# Patient Record
Sex: Male | Born: 1960 | Race: White | Hispanic: No | Smoking: Former smoker
Health system: Southern US, Community
[De-identification: ages and names within clinical notes are randomized; demographics above are authoritative.]

## PROBLEM LIST (undated history)

## (undated) DIAGNOSIS — Z972 Presence of dental prosthetic device (complete) (partial): Secondary | ICD-10-CM

## (undated) DIAGNOSIS — R351 Nocturia: Secondary | ICD-10-CM

## (undated) DIAGNOSIS — M75102 Unspecified rotator cuff tear or rupture of left shoulder, not specified as traumatic: Secondary | ICD-10-CM

## (undated) DIAGNOSIS — Z87820 Personal history of traumatic brain injury: Secondary | ICD-10-CM

## (undated) DIAGNOSIS — M109 Gout, unspecified: Secondary | ICD-10-CM

## (undated) DIAGNOSIS — Z8711 Personal history of peptic ulcer disease: Secondary | ICD-10-CM

## (undated) DIAGNOSIS — Z8719 Personal history of other diseases of the digestive system: Secondary | ICD-10-CM

## (undated) DIAGNOSIS — M5020 Other cervical disc displacement, unspecified cervical region: Secondary | ICD-10-CM

## (undated) DIAGNOSIS — Z9189 Other specified personal risk factors, not elsewhere classified: Secondary | ICD-10-CM

## (undated) DIAGNOSIS — Z973 Presence of spectacles and contact lenses: Secondary | ICD-10-CM

## (undated) DIAGNOSIS — I341 Nonrheumatic mitral (valve) prolapse: Secondary | ICD-10-CM

## (undated) DIAGNOSIS — S62501B Fracture of unspecified phalanx of right thumb, initial encounter for open fracture: Secondary | ICD-10-CM

## (undated) DIAGNOSIS — K219 Gastro-esophageal reflux disease without esophagitis: Secondary | ICD-10-CM

---

## 1979-08-25 HISTORY — PX: OTHER SURGICAL HISTORY: SHX169

## 1989-08-24 HISTORY — PX: COLONOSCOPY: SHX174

## 2001-11-02 ENCOUNTER — Emergency Department (HOSPITAL_COMMUNITY): Admission: EM | Admit: 2001-11-02 | Discharge: 2001-11-02 | Payer: Self-pay | Admitting: Emergency Medicine

## 2001-11-03 ENCOUNTER — Encounter: Payer: Self-pay | Admitting: Emergency Medicine

## 2006-07-13 ENCOUNTER — Ambulatory Visit: Payer: Self-pay | Admitting: Psychiatry

## 2006-07-13 ENCOUNTER — Inpatient Hospital Stay (HOSPITAL_COMMUNITY): Admission: AD | Admit: 2006-07-13 | Discharge: 2006-07-16 | Payer: Self-pay | Admitting: Psychiatry

## 2006-07-13 ENCOUNTER — Emergency Department (HOSPITAL_COMMUNITY): Admission: EM | Admit: 2006-07-13 | Discharge: 2006-07-13 | Payer: Self-pay | Admitting: Emergency Medicine

## 2007-10-09 ENCOUNTER — Emergency Department (HOSPITAL_COMMUNITY): Admission: EM | Admit: 2007-10-09 | Discharge: 2007-10-10 | Payer: Self-pay | Admitting: Emergency Medicine

## 2010-06-02 ENCOUNTER — Emergency Department: Payer: Self-pay | Admitting: Emergency Medicine

## 2010-10-25 ENCOUNTER — Emergency Department (HOSPITAL_COMMUNITY): Admission: EM | Admit: 2010-10-25 | Discharge: 2010-10-25 | Payer: Self-pay | Admitting: Emergency Medicine

## 2011-03-06 LAB — POCT CARDIAC MARKERS
CKMB, poc: 2.1 ng/mL (ref 1.0–8.0)
Myoglobin, poc: 113 ng/mL (ref 12–200)
Troponin i, poc: <0.05 ng/mL (ref 0.00–0.09)

## 2011-03-06 LAB — RAPID URINE DRUG SCREEN, HOSP PERFORMED
Amphetamines: NOT DETECTED
Barbiturates: NOT DETECTED
Benzodiazepines: NOT DETECTED
Cocaine: NOT DETECTED
Opiates: NOT DETECTED
Tetrahydrocannabinol: NOT DETECTED

## 2011-03-06 LAB — POCT I-STAT, CHEM 8
BUN: 12 mg/dL (ref 6–23)
Calcium, Ion: 1.08 mmol/L — ABNORMAL LOW (ref 1.12–1.32)
Chloride: 107 mEq/L (ref 96–112)
Creatinine, Ser: 0.9 mg/dL (ref 0.4–1.5)
Glucose, Bld: 98 mg/dL (ref 70–99)
HCT: 46 % (ref 39.0–52.0)
Hemoglobin: 15.6 g/dL (ref 13.0–17.0)
Potassium: 3.7 mEq/L (ref 3.5–5.1)
Sodium: 140 mEq/L (ref 135–145)
TCO2: 25 mmol/L (ref 0–100)
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
## 2011-10-03 LAB — URINALYSIS, ROUTINE W REFLEX MICROSCOPIC
Nitrite: NEGATIVE
Specific Gravity, Urine: 1.023
Urobilinogen, UA: 0.2
pH: 5.5

## 2011-10-03 LAB — COMPREHENSIVE METABOLIC PANEL
ALT: 15
AST: 24
Alkaline Phosphatase: 61
CO2: 31
Calcium: 10
GFR calc Af Amer: >60
Glucose, Bld: 93
Potassium: 4.8
Sodium: 142
Total Protein: 6.3

## 2011-10-03 LAB — RAPID URINE DRUG SCREEN, HOSP PERFORMED
Amphetamines: POSITIVE — AB
Opiates: NOT DETECTED
Tetrahydrocannabinol: NOT DETECTED

## 2011-10-03 LAB — CBC
Hemoglobin: 13.9
RBC: 4.4
RDW: 12.8

## 2011-10-03 LAB — DIFFERENTIAL
Basophils Relative: 0
Eosinophils Absolute: 0.3
Eosinophils Relative: 3
Lymphs Abs: 2.3
Monocytes Relative: 4
Neutrophils Relative %: 69

## 2011-10-03 LAB — ETHANOL: Alcohol, Ethyl (B): <5

## 2011-10-31 IMAGING — CR DG CHEST 2V
3 series · 3 of 3 positions shown · non-contrast
Comparison: No

CLINICAL DATA: Smoker, chills, shortness of breath.  Chest pain,
palpitations.

CHEST - 2 VIEW

[w chest pa]
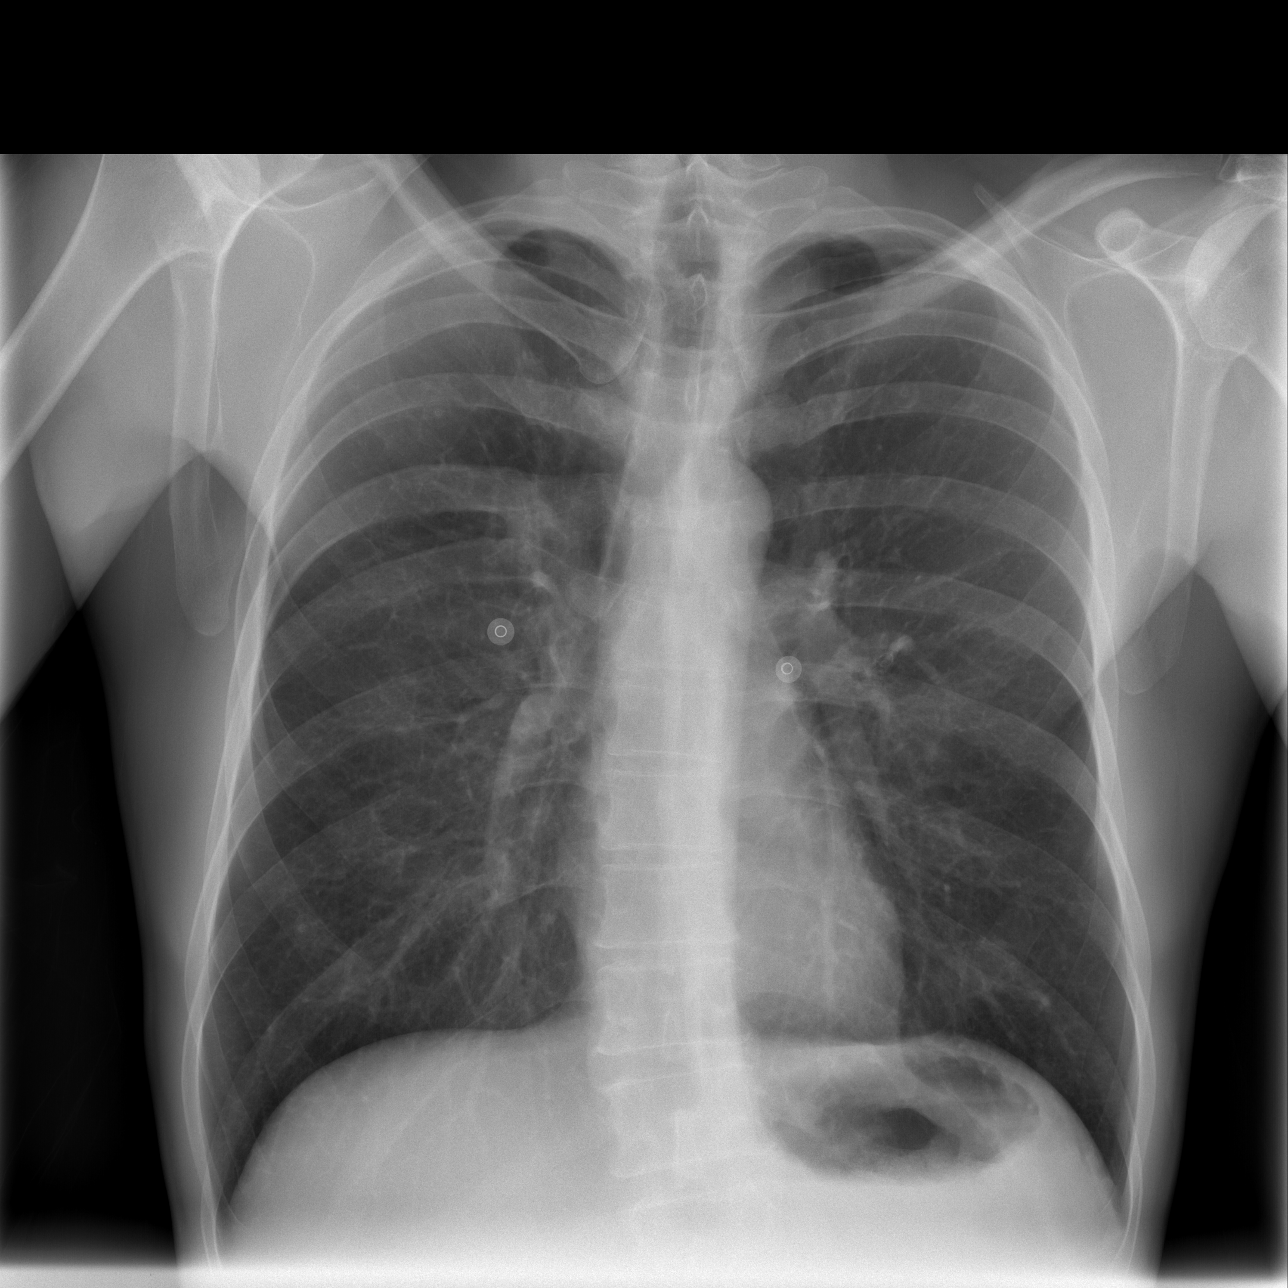

[w chest lat]
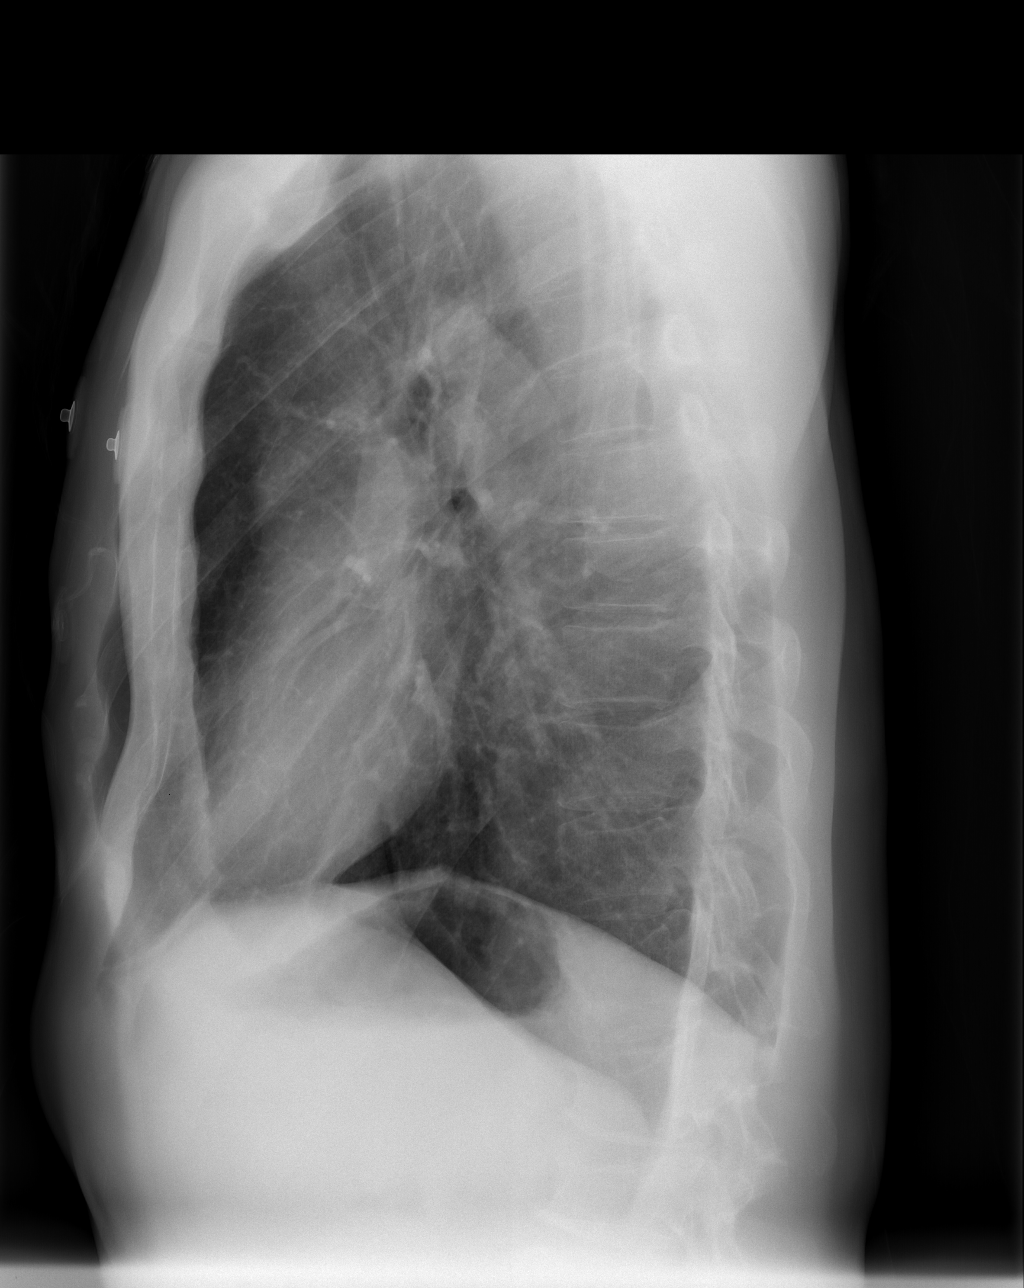

[w chest ap]
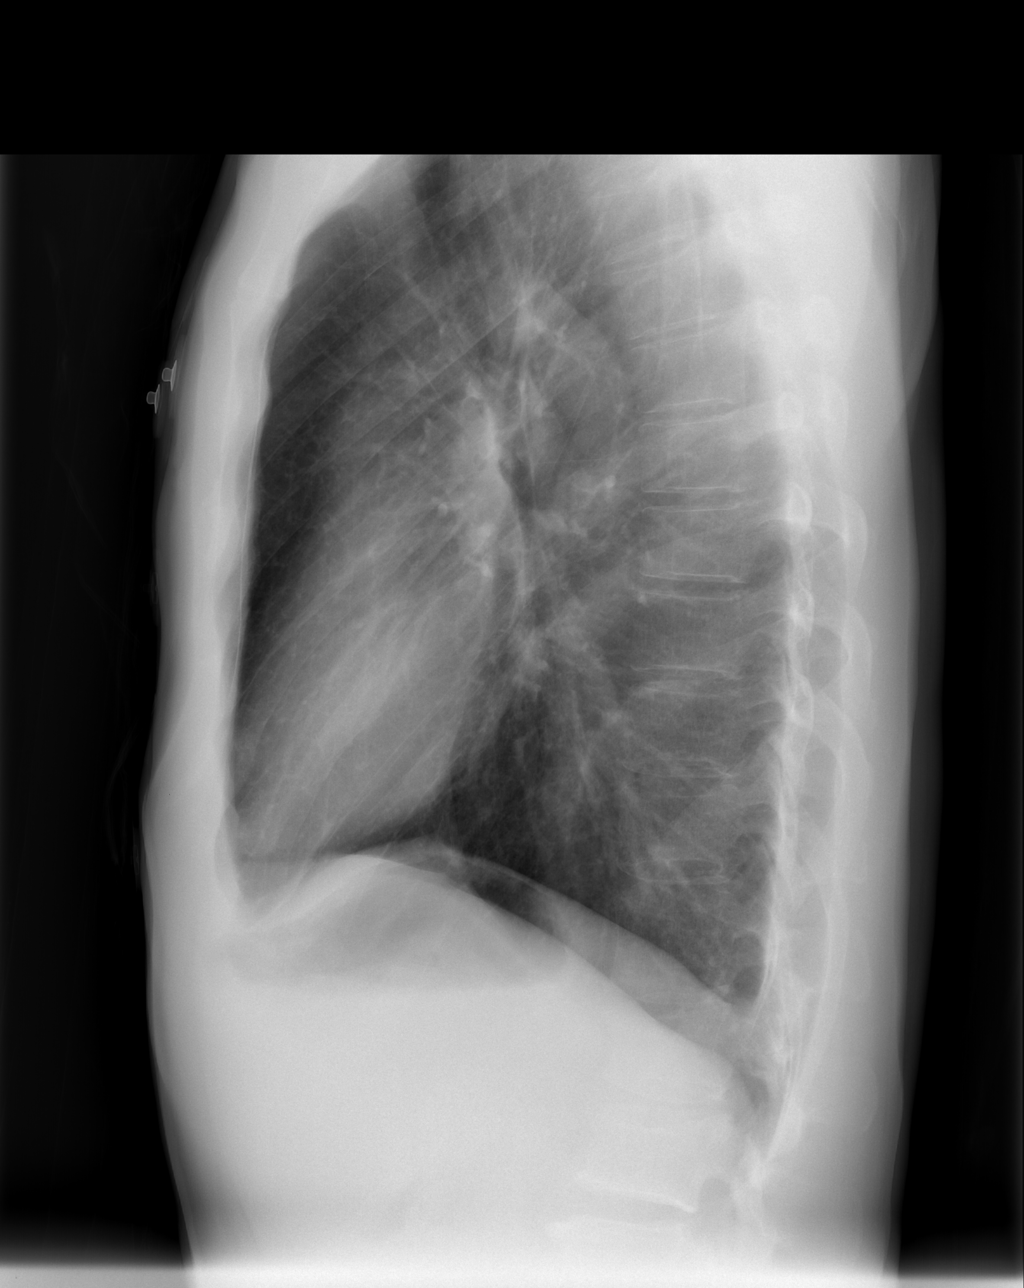

[3 of 3 positions shown; findings below may reference images not displayed]

FINDINGS: Mild hyperinflation of the lungs. Heart and mediastinal
contours are within normal limits.  No focal opacities or
effusions.  No acute bony abnormality.
IMPRESSION: No acute cardiopulmonary disease.

## 2011-11-13 ENCOUNTER — Ambulatory Visit: Payer: Self-pay

## 2014-09-05 ENCOUNTER — Emergency Department (HOSPITAL_COMMUNITY)
Admission: EM | Admit: 2014-09-05 | Discharge: 2014-09-05 | Disposition: A | Discharge status: Home / Self Care | Payer: Managed Care, Other (non HMO) | Attending: Emergency Medicine | Admitting: Emergency Medicine

## 2014-09-05 ENCOUNTER — Encounter (HOSPITAL_COMMUNITY): Payer: Self-pay | Admitting: Emergency Medicine

## 2014-09-05 ENCOUNTER — Emergency Department (HOSPITAL_COMMUNITY): Payer: Managed Care, Other (non HMO)

## 2014-09-05 DIAGNOSIS — Y9389 Activity, other specified: Secondary | ICD-10-CM | POA: Diagnosis not present

## 2014-09-05 DIAGNOSIS — Y9289 Other specified places as the place of occurrence of the external cause: Secondary | ICD-10-CM | POA: Insufficient documentation

## 2014-09-05 DIAGNOSIS — Z23 Encounter for immunization: Secondary | ICD-10-CM | POA: Diagnosis not present

## 2014-09-05 DIAGNOSIS — Z79899 Other long term (current) drug therapy: Secondary | ICD-10-CM | POA: Insufficient documentation

## 2014-09-05 DIAGNOSIS — Z792 Long term (current) use of antibiotics: Secondary | ICD-10-CM | POA: Diagnosis not present

## 2014-09-05 DIAGNOSIS — M109 Gout, unspecified: Secondary | ICD-10-CM | POA: Insufficient documentation

## 2014-09-05 DIAGNOSIS — W268XXA Contact with other sharp object(s), not elsewhere classified, initial encounter: Secondary | ICD-10-CM | POA: Diagnosis not present

## 2014-09-05 DIAGNOSIS — S62501B Fracture of unspecified phalanx of right thumb, initial encounter for open fracture: Secondary | ICD-10-CM

## 2014-09-05 DIAGNOSIS — S6990XA Unspecified injury of unspecified wrist, hand and finger(s), initial encounter: Secondary | ICD-10-CM | POA: Diagnosis present

## 2014-09-05 DIAGNOSIS — S6980XA Other specified injuries of unspecified wrist, hand and finger(s), initial encounter: Secondary | ICD-10-CM | POA: Insufficient documentation

## 2014-09-05 DIAGNOSIS — S62639B Displaced fracture of distal phalanx of unspecified finger, initial encounter for open fracture: Secondary | ICD-10-CM | POA: Insufficient documentation

## 2014-09-05 MED ORDER — CLINDAMYCIN PHOSPHATE 600 MG/50ML IV SOLN
600.0000 mg | Freq: Once | INTRAVENOUS | Status: AC
  Administered 2014-09-05: 600 mg via INTRAVENOUS
  Filled 2014-09-05: qty 50

## 2014-09-05 MED ORDER — HYDROMORPHONE HCL PF 1 MG/ML IJ SOLN
1.0000 mg | Freq: Once | INTRAMUSCULAR | Status: AC
  Administered 2014-09-05: 1 mg via INTRAVENOUS
  Filled 2014-09-05: qty 1

## 2014-09-05 MED ORDER — LIDOCAINE HCL 2 % IJ SOLN
5.0000 mL | Freq: Once | INTRAMUSCULAR | Status: AC
  Administered 2014-09-05: 100 mg
  Filled 2014-09-05: qty 20

## 2014-09-05 MED ORDER — CEPHALEXIN 500 MG PO CAPS: 500.0000 mg | ORAL_CAPSULE | Freq: Four times a day (QID) | ORAL | Status: DC

## 2014-09-05 MED ORDER — TETANUS-DIPHTH-ACELL PERTUSSIS 5-2.5-18.5 LF-MCG/0.5 IM SUSP
0.5000 mL | Freq: Once | INTRAMUSCULAR | Status: AC
  Administered 2014-09-05: 0.5 mL via INTRAMUSCULAR
  Filled 2014-09-05: qty 0.5

## 2014-09-05 MED ORDER — HYDROMORPHONE HCL PF 1 MG/ML IJ SOLN
0.5000 mg | Freq: Once | INTRAMUSCULAR | Status: AC
  Administered 2014-09-05: 0.5 mg via INTRAVENOUS
  Filled 2014-09-05: qty 1

## 2014-09-05 MED ORDER — HYDROMORPHONE HCL PF 1 MG/ML IJ SOLN: 1.0000 mg | Freq: Once | INTRAMUSCULAR | Status: AC

## 2014-09-05 MED ORDER — ONDANSETRON HCL 4 MG/2ML IJ SOLN
4.0000 mg | Freq: Once | INTRAMUSCULAR | Status: AC
  Administered 2014-09-05: 4 mg via INTRAVENOUS
  Filled 2014-09-05: qty 2

## 2014-09-05 MED ORDER — OXYCODONE-ACETAMINOPHEN 5-325 MG PO TABS: 1.0000 | ORAL_TABLET | Freq: Four times a day (QID) | ORAL | Status: DC | PRN

## 2014-09-05 NOTE — ED Provider Notes (Signed)
CSN: [id number]     Arrival date & time 09/05/14  1057 History   First MD Initiated Contact with Patient 09/05/14 1111     Chief Complaint  Patient presents with  . Finger Injury     (Consider location/radiation/quality/duration/timing/severity/associated sxs/prior Treatment) HPI Comments: Pt states that a shot time ago he was using a wood splitter and he cut the tip of the hit right thumb off. Denies previous injury. Bleeding is controlled.   The history is provided by the patient. No language interpreter was used.    Past Medical History  Diagnosis Date  . Gout    Past Surgical History  Procedure Laterality Date  . Rotator cuff repair     History reviewed. No pertinent family history. History  Substance Use Topics  . Smoking status: Not on file  . Smokeless tobacco: Not on file  . Alcohol Use: No    Review of Systems  Constitutional: Negative.   Respiratory: Negative.   Cardiovascular: Negative.       Allergies  Review of patient's allergies indicates no known allergies.  Home Medications   Prior to Admission medications   Medication Sig Start Date End Date Taking? Authorizing Provider  allopurinol (ZYLOPRIM) 100 MG tablet Take 100 mg by mouth daily. 05/30/14  Yes Historical Provider, MD  ALPRAZolam Prudy Feeler) 0.5 MG tablet Take 0.5 mg by mouth daily as needed for anxiety.  08/26/14  Yes Historical Provider, MD  amoxicillin (AMOXIL) 500 MG capsule Take 500 mg by mouth 3 (three) times daily.   Yes Historical Provider, MD  HYDROcodone-acetaminophen (NORCO/VICODIN) 5-325 MG per tablet Take 1 tablet by mouth daily.  09/02/14  Yes Historical Provider, MD  traMADol (ULTRAM) 50 MG tablet Take 50 mg by mouth 2 (two) times daily. 08/27/14  Yes Historical Provider, MD   BP 129/106  Pulse 82  Temp(Src) 97.9 F (36.6 C) (Oral)  Resp 18  SpO2 98% Physical Exam  Nursing note and vitals reviewed. Constitutional: He is oriented to person, place, and time. He appears well-developed  and well-nourished.  Cardiovascular: Normal rate and regular rhythm.   Pulmonary/Chest: Effort normal.  Musculoskeletal: Normal range of motion.  Amputated tip of the right thumb about 1/2 way thru the nail. Full rom  Neurological: He is alert and oriented to person, place, and time.    ED Course  NERVE BLOCK Date/Time: 09/05/2014 1:06 PM Performed by: Teressa Lower Authorized by: Teressa Lower Consent: Verbal consent obtained. Risks and benefits: risks, benefits and alternatives were discussed Consent given by: patient Patient identity confirmed: verbally with patient Indications: pain relief Body area: upper extremity Nerve: digital Laterality: right Patient tolerance: Patient tolerated the procedure well with no immediate complications. Comments: Wound cleaned and dressed   (including critical care time) Labs Review Labs Reviewed - No data to display  Imaging Review Dg Finger Thumb Right  09/05/2014   CLINICAL DATA:  Injury to the right thumb.  EXAM: RIGHT THUMB 2+V  COMPARISON:  No priors.  FINDINGS: There is a large soft tissue defect on the distal aspect of the right thumb. There appears to be a small osseous defect from the tuft of the first distal phalanx. Visualized bones otherwise appear intact.  IMPRESSION: 1. Small open fracture of the tuft of the distal phalanx of the right first digit.   Electronically Signed   By: Trudie Reed M.D.   On: 09/05/2014 12:31     EKG Interpretation None      MDM   Final diagnoses:  Thumb fracture, right, open, initial encounter    Spoke with Dr. Victorino Dike from ortho and he said to clean the area. Place a dressing and pt can follow up with Dr. Melvyn Novas tomorrow. Wound cleaned and dressed. Will send home with keflex and percocet of pain    Teressa Lower, NP 09/05/14 1325

## 2014-09-05 NOTE — ED Notes (Signed)
Reports injuring right thumb while using a wood splitter. Finger tip amputated. Bleeding controlled.

## 2014-09-05 NOTE — ED Notes (Signed)
Pt to xray

## 2014-09-05 NOTE — ED Notes (Signed)
Pt refusing to sit in bed and have vital signs updated, pt states "I can't sit still and need something else for pain, why can't you just numb my thumb." [place] notified.

## 2014-09-05 NOTE — ED Notes (Signed)
MD at bedside. 

## 2014-09-05 NOTE — Discharge Instructions (Signed)

## 2014-09-07 ENCOUNTER — Encounter (HOSPITAL_BASED_OUTPATIENT_CLINIC_OR_DEPARTMENT_OTHER): Payer: Self-pay | Admitting: *Deleted

## 2014-09-07 NOTE — Progress Notes (Signed)
NPO AFTER MN WITH EXCEPTION CLEAR LIQUIDS UNTIL 1000 (NO CREAM/ MILK PRODUCTS).  ARRIVE AT 1400. NEEDS HG. WILL TAKE ZANTAC AND TRAMADOL AM DOS W/ SIPS OF WATER.

## 2014-09-07 NOTE — Progress Notes (Signed)
09/07/14 1546  OBSTRUCTIVE SLEEP APNEA  Have you ever been diagnosed with sleep apnea through a sleep study? No  Do you snore loudly (loud enough to be heard through closed doors)?  0  Do you often feel tired, fatigued, or sleepy during the daytime? 0  Has anyone observed you stop breathing during your sleep? 1  Do you have, or are you being treated for high blood pressure? 0  BMI more than 35 kg/m2? 0  Age over 54 years old? 1  Neck circumference greater than 40 cm/16 inches? 1  Gender: 1  Obstructive Sleep Apnea Score 4  Score 4 or greater  Results sent to PCP

## 2014-09-08 NOTE — ED Provider Notes (Signed)
Medical screening examination/treatment/procedure(s) were performed by non-physician practitioner and as supervising physician I was immediately available for consultation/collaboration.   EKG Interpretation None        Samuel Jester, DO 09/08/14 [phone]

## 2014-09-09 ENCOUNTER — Encounter (HOSPITAL_BASED_OUTPATIENT_CLINIC_OR_DEPARTMENT_OTHER): Payer: Managed Care, Other (non HMO) | Admitting: Anesthesiology

## 2014-09-09 ENCOUNTER — Encounter (HOSPITAL_BASED_OUTPATIENT_CLINIC_OR_DEPARTMENT_OTHER)
Admission: RE | Disposition: A | Discharge status: Home / Self Care | Payer: Self-pay | Source: Ambulatory Visit | Attending: Orthopedic Surgery

## 2014-09-09 ENCOUNTER — Ambulatory Visit (HOSPITAL_BASED_OUTPATIENT_CLINIC_OR_DEPARTMENT_OTHER)
Admission: RE | Admit: 2014-09-09 | Discharge: 2014-09-09 | Disposition: A | Discharge status: Home / Self Care | Payer: Managed Care, Other (non HMO) | Source: Ambulatory Visit | Attending: Orthopedic Surgery | Admitting: Orthopedic Surgery

## 2014-09-09 ENCOUNTER — Ambulatory Visit (HOSPITAL_BASED_OUTPATIENT_CLINIC_OR_DEPARTMENT_OTHER): Payer: Managed Care, Other (non HMO) | Admitting: Anesthesiology

## 2014-09-09 ENCOUNTER — Encounter (HOSPITAL_BASED_OUTPATIENT_CLINIC_OR_DEPARTMENT_OTHER): Payer: Self-pay | Admitting: *Deleted

## 2014-09-09 DIAGNOSIS — X58XXXA Exposure to other specified factors, initial encounter: Secondary | ICD-10-CM | POA: Insufficient documentation

## 2014-09-09 DIAGNOSIS — M109 Gout, unspecified: Secondary | ICD-10-CM | POA: Diagnosis not present

## 2014-09-09 DIAGNOSIS — Z9109 Other allergy status, other than to drugs and biological substances: Secondary | ICD-10-CM | POA: Diagnosis not present

## 2014-09-09 DIAGNOSIS — S62524B Nondisplaced fracture of distal phalanx of right thumb, initial encounter for open fracture: Secondary | ICD-10-CM

## 2014-09-09 DIAGNOSIS — K219 Gastro-esophageal reflux disease without esophagitis: Secondary | ICD-10-CM | POA: Insufficient documentation

## 2014-09-09 DIAGNOSIS — S62639A Displaced fracture of distal phalanx of unspecified finger, initial encounter for closed fracture: Secondary | ICD-10-CM | POA: Diagnosis not present

## 2014-09-09 DIAGNOSIS — Z79899 Other long term (current) drug therapy: Secondary | ICD-10-CM | POA: Insufficient documentation

## 2014-09-09 DIAGNOSIS — Z87891 Personal history of nicotine dependence: Secondary | ICD-10-CM | POA: Insufficient documentation

## 2014-09-09 DIAGNOSIS — S62639B Displaced fracture of distal phalanx of unspecified finger, initial encounter for open fracture: Secondary | ICD-10-CM | POA: Diagnosis present

## 2014-09-09 DIAGNOSIS — I059 Rheumatic mitral valve disease, unspecified: Secondary | ICD-10-CM | POA: Insufficient documentation

## 2014-09-09 HISTORY — DX: Presence of dental prosthetic device (complete) (partial): Z97.2

## 2014-09-09 HISTORY — DX: Nonrheumatic mitral (valve) prolapse: I34.1

## 2014-09-09 HISTORY — PX: DEBRIDEMENT AND CLOSURE WOUND: SHX5614

## 2014-09-09 HISTORY — DX: Personal history of peptic ulcer disease: Z87.11

## 2014-09-09 HISTORY — DX: Other cervical disc displacement, unspecified cervical region: M50.20

## 2014-09-09 HISTORY — DX: Gout, unspecified: M10.9

## 2014-09-09 HISTORY — DX: Gastro-esophageal reflux disease without esophagitis: K21.9

## 2014-09-09 HISTORY — DX: Presence of spectacles and contact lenses: Z97.3

## 2014-09-09 HISTORY — DX: Personal history of traumatic brain injury: Z87.820

## 2014-09-09 HISTORY — DX: Unspecified rotator cuff tear or rupture of left shoulder, not specified as traumatic: M75.102

## 2014-09-09 HISTORY — DX: Other specified personal risk factors, not elsewhere classified: Z91.89

## 2014-09-09 HISTORY — DX: Nocturia: R35.1

## 2014-09-09 HISTORY — DX: Fracture of unspecified phalanx of right thumb, initial encounter for open fracture: S62.501B

## 2014-09-09 HISTORY — DX: Personal history of other diseases of the digestive system: Z87.19

## 2014-09-09 LAB — POCT HEMOGLOBIN-HEMACUE: Hemoglobin: 13.8 g/dL (ref 13.0–17.0)

## 2014-09-09 SURGERY — DEBRIDEMENT, WOUND, WITH CLOSURE
Anesthesia: Monitor Anesthesia Care | Site: Thumb | Laterality: Right

## 2014-09-09 MED ORDER — MIDAZOLAM HCL 2 MG/2ML IJ SOLN
INTRAMUSCULAR | Status: AC
  Filled 2014-09-09: qty 2

## 2014-09-09 MED ORDER — BUPIVACAINE HCL (PF) 0.25 % IJ SOLN
INTRAMUSCULAR | Status: DC | PRN
  Administered 2014-09-09: 10 mL

## 2014-09-09 MED ORDER — OXYCODONE-ACETAMINOPHEN 5-325 MG PO TABS: 1.0000 | ORAL_TABLET | Freq: Four times a day (QID) | ORAL | Status: AC | PRN

## 2014-09-09 MED ORDER — MIDAZOLAM HCL 5 MG/5ML IJ SOLN
INTRAMUSCULAR | Status: DC | PRN
  Administered 2014-09-09: 2 mg via INTRAVENOUS

## 2014-09-09 MED ORDER — FENTANYL CITRATE 0.05 MG/ML IJ SOLN
INTRAMUSCULAR | Status: DC | PRN
  Administered 2014-09-09: 100 ug via INTRAVENOUS

## 2014-09-09 MED ORDER — LACTATED RINGERS IV SOLN
INTRAVENOUS | Status: DC
  Administered 2014-09-09: 15:00:00 via INTRAVENOUS
  Filled 2014-09-09: qty 1000

## 2014-09-09 MED ORDER — LIDOCAINE HCL 1 % IJ SOLN
INTRAMUSCULAR | Status: DC | PRN
  Administered 2014-09-09: 10 mL

## 2014-09-09 MED ORDER — CEFAZOLIN SODIUM 1-5 GM-% IV SOLN
INTRAVENOUS | Status: DC | PRN
  Administered 2014-09-09: 1 g via INTRAVENOUS

## 2014-09-09 MED ORDER — FENTANYL CITRATE 0.05 MG/ML IJ SOLN
INTRAMUSCULAR | Status: AC
  Filled 2014-09-09: qty 2

## 2014-09-09 MED ORDER — PROPOFOL 10 MG/ML IV BOLUS
INTRAVENOUS | Status: DC | PRN
  Administered 2014-09-09: 50 mg via INTRAVENOUS

## 2014-09-09 MED ORDER — PROPOFOL 10 MG/ML IV EMUL
INTRAVENOUS | Status: DC | PRN
  Administered 2014-09-09: 50 ug/kg/min via INTRAVENOUS

## 2014-09-09 SURGICAL SUPPLY — 63 items
BANDAGE CO FLEX L/F 1IN X 5YD (GAUZE/BANDAGES/DRESSINGS) ×6 IMPLANT
BANDAGE CONFORM 3  STR LF (GAUZE/BANDAGES/DRESSINGS) IMPLANT
BANDAGE ELASTIC 3 VELCRO ST LF (GAUZE/BANDAGES/DRESSINGS) IMPLANT
BANDAGE ELASTIC 4 VELCRO ST LF (GAUZE/BANDAGES/DRESSINGS) IMPLANT
BANDAGE GAUZE ELAST BULKY 4 IN (GAUZE/BANDAGES/DRESSINGS) IMPLANT
BENZOIN TINCTURE PRP APPL 2/3 (GAUZE/BANDAGES/DRESSINGS) IMPLANT
BLADE SURG 15 STRL LF DISP TIS (BLADE) ×1 IMPLANT
BLADE SURG 15 STRL SS (BLADE) ×2
BNDG COHESIVE 3X5 TAN STRL LF (GAUZE/BANDAGES/DRESSINGS) IMPLANT
BNDG CONFORM 2 STRL LF (GAUZE/BANDAGES/DRESSINGS) ×3 IMPLANT
BNDG ESMARK 4X9 LF (GAUZE/BANDAGES/DRESSINGS) ×3 IMPLANT
BNDG GAUZE ELAST 4 BULKY (GAUZE/BANDAGES/DRESSINGS) IMPLANT
CLOSURE WOUND 1/2 X4 (GAUZE/BANDAGES/DRESSINGS)
CLOTH BEACON ORANGE TIMEOUT ST (SAFETY) ×3 IMPLANT
CORDS BIPOLAR (ELECTRODE) IMPLANT
COVER TABLE BACK 60X90 (DRAPES) ×3 IMPLANT
CUFF TOURNIQUET SINGLE 18IN (TOURNIQUET CUFF) ×3 IMPLANT
CUFF TOURNIQUET SINGLE 24IN (TOURNIQUET CUFF) IMPLANT
DRAIN PENROSE 18X1/4 LTX STRL (WOUND CARE) IMPLANT
DRAPE EXTREMITY T 121X128X90 (DRAPE) ×3 IMPLANT
DRAPE OEC MINIVIEW 54X84 (DRAPES) IMPLANT
DRAPE SURG 17X23 STRL (DRAPES) ×3 IMPLANT
DRSG EMULSION OIL 3X3 NADH (GAUZE/BANDAGES/DRESSINGS) ×3 IMPLANT
ELECT NEEDLE TIP 2.8 STRL (NEEDLE) IMPLANT
ELECT REM PT RETURN 9FT ADLT (ELECTROSURGICAL)
ELECTRODE REM PT RTRN 9FT ADLT (ELECTROSURGICAL) IMPLANT
GAUZE SPONGE 4X4 16PLY XRAY LF (GAUZE/BANDAGES/DRESSINGS) ×3 IMPLANT
GAUZE XEROFORM 1X8 LF (GAUZE/BANDAGES/DRESSINGS) IMPLANT
GLOVE BIO SURGEON STRL SZ7.5 (GLOVE) ×3 IMPLANT
GLOVE BIO SURGEON STRL SZ8 (GLOVE) ×3 IMPLANT
GLOVE INDICATOR 7.5 STRL GRN (GLOVE) ×6 IMPLANT
GOWN STRL REUS W/ TWL XL LVL3 (GOWN DISPOSABLE) IMPLANT
GOWN STRL REUS W/TWL LRG LVL3 (GOWN DISPOSABLE) ×3 IMPLANT
GOWN STRL REUS W/TWL XL LVL3 (GOWN DISPOSABLE) ×3 IMPLANT
K-WIRE .035X4 (WIRE) IMPLANT
K-WIRE .045X4 (WIRE) IMPLANT
LOOP VESSEL MAXI BLUE (MISCELLANEOUS) IMPLANT
NEEDLE HYPO 25X1 1.5 SAFETY (NEEDLE) ×6 IMPLANT
NS IRRIG 500ML POUR BTL (IV SOLUTION) ×3 IMPLANT
PACK BASIN DAY SURGERY FS (CUSTOM PROCEDURE TRAY) ×3 IMPLANT
PAD CAST 3X4 CTTN HI CHSV (CAST SUPPLIES) ×1 IMPLANT
PADDING CAST ABS 4INX4YD NS (CAST SUPPLIES)
PADDING CAST ABS COTTON 4X4 ST (CAST SUPPLIES) IMPLANT
PADDING CAST COTTON 3X4 STRL (CAST SUPPLIES) ×2
PENCIL BUTTON HOLSTER BLD 10FT (ELECTRODE) IMPLANT
SPLINT PLASTER CAST XFAST 3X15 (CAST SUPPLIES) IMPLANT
SPLINT PLASTER CAST XFAST 4X15 (CAST SUPPLIES) IMPLANT
SPLINT PLASTER XTRA FAST SET 4 (CAST SUPPLIES)
SPLINT PLASTER XTRA FASTSET 3X (CAST SUPPLIES)
SPONGE GAUZE 4X4 12PLY (GAUZE/BANDAGES/DRESSINGS) IMPLANT
SPONGE GAUZE 4X4 12PLY STER LF (GAUZE/BANDAGES/DRESSINGS) ×3 IMPLANT
STOCKINETTE 4X48 STRL (DRAPES) ×3 IMPLANT
STRIP CLOSURE SKIN 1/2X4 (GAUZE/BANDAGES/DRESSINGS) IMPLANT
SUT CHROMIC 5 0 RB 1 27 (SUTURE) ×3 IMPLANT
SUT ETHILON 4 0 P 3 18 (SUTURE) IMPLANT
SUT ETHILON 5 0 P 3 18 (SUTURE)
SUT NYLON ETHILON 5-0 P-3 1X18 (SUTURE) IMPLANT
SUT PROLENE 4 0 PS 2 18 (SUTURE) ×3 IMPLANT
SYR BULB 3OZ (MISCELLANEOUS) ×3 IMPLANT
SYR CONTROL 10ML LL (SYRINGE) IMPLANT
TOWEL OR 17X24 6PK STRL BLUE (TOWEL DISPOSABLE) ×6 IMPLANT
UNDERPAD 30X30 INCONTINENT (UNDERPADS AND DIAPERS) ×3 IMPLANT
WATER STERILE IRR 500ML POUR (IV SOLUTION) IMPLANT

## 2014-09-09 NOTE — Anesthesia Postprocedure Evaluation (Signed)
Anesthesia Post Note  Patient: Joe Lucas  Procedure(s) Performed: Procedure(s) (LRB): RIGHT THUMB OPEN DEBRIDEMENT AND ADVANCEMENT FLAP CLOSURE  (Right)  Anesthesia type: MAC  Patient location: PACU  Post pain: Pain level controlled  Post assessment: Post-op Vital signs reviewed  Last Vitals: BP 119/87  Pulse 64  Temp(Src) 36.6 C (Oral)  Resp 16  Ht  (1.778 m)  Wt 146 lb 8 oz (66.452 kg)  BMI 21.02 kg/m2  SpO2 97%  Post vital signs: Reviewed  Level of consciousness: awake  Complications: No apparent anesthesia complications

## 2014-09-09 NOTE — Brief Op Note (Signed)
09/09/2014  3:46 PM  PATIENT:  Merwyn Katos  53 y.o. male  PRE-OPERATIVE DIAGNOSIS:  RIGHT THUMB OPEN FRACTURE   POST-OPERATIVE DIAGNOSIS:  RIGHT THUMB OPEN FRACTURE  PROCEDURE:  Procedure(s): RIGHT THUMB OPEN DEBRIDEMENT AND ADVANCEMENT FLAP CLOSURE  (Right)  SURGEON:  Surgeon(s) and Role:    * [name], MD - Primary  PHYSICIAN ASSISTANT:   ASSISTANTS: none   ANESTHESIA:   MAC  EBL:     BLOOD ADMINISTERED:none  DRAINS: none   LOCAL MEDICATIONS USED:  MARCAINE     SPECIMEN:  No Specimen  DISPOSITION OF SPECIMEN:  N/A  COUNTS:  YES  TOURNIQUET:  * No tourniquets in log *  DICTATION: .Other Dictation: Dictation Number [id number]  PLAN OF CARE: Discharge to home after PACU  PATIENT DISPOSITION:  PACU - hemodynamically stable.   Delay start of Pharmacological VTE agent (>24hrs) due to surgical blood loss or risk of bleeding: not applicable

## 2014-09-09 NOTE — Transfer of Care (Signed)
Immediate Anesthesia Transfer of Care Note  Patient: Joe Lucas  Procedure(s) Performed: Procedure(s): RIGHT THUMB OPEN DEBRIDEMENT AND ADVANCEMENT FLAP CLOSURE  (Right)  Patient Location: phase2  Anesthesia Type:MAC  Level of Consciousness: awake, alert  and oriented  Airway & Oxygen Therapy: Patient Spontanous Breathing  Post-op Assessment: Report given to PACU RN  Post vital signs: Reviewed and stable  Complications: No apparent anesthesia complications
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
## 2014-09-09 NOTE — Anesthesia Preprocedure Evaluation (Addendum)
Anesthesia Evaluation  Patient identified by MRN, date of birth, ID band Patient awake    Reviewed: Allergy & Precautions, H&P , NPO status , Patient's Chart, lab work & pertinent test results  Airway Mallampati: II TM Distance: >3 FB Neck ROM: Full    Dental no notable dental hx.    Pulmonary former smoker,  breath sounds clear to auscultation  Pulmonary exam normal       Cardiovascular negative cardio ROS  Rhythm:Regular Rate:Normal     Neuro/Psych negative neurological ROS  negative psych ROS   GI/Hepatic Neg liver ROS, GERD-  Medicated,  Endo/Other  negative endocrine ROS  Renal/GU negative Renal ROS     Musculoskeletal  (+) Arthritis -,   Abdominal   Peds  Hematology negative hematology ROS (+)   Anesthesia Other Findings   Reproductive/Obstetrics negative OB ROS                         Anesthesia Physical Anesthesia Plan  ASA: I  Anesthesia Plan: MAC   Post-op Pain Management:    Induction: Intravenous  Airway Management Planned: Natural Airway  Additional Equipment: None  Intra-op Plan:   Post-operative Plan:   Informed Consent: I have reviewed the patients History and Physical, chart, labs and discussed the procedure including the risks, benefits and alternatives for the proposed anesthesia with the patient or authorized representative who has indicated his/her understanding and acceptance.   Dental advisory given  Plan Discussed with: CRNA  Anesthesia Plan Comments:        Anesthesia Quick Evaluation

## 2014-09-09 NOTE — Discharge Instructions (Signed)
KEEP BANDAGE CLEAN AND DRY CALL OFFICE FOR F/U APPT [phone] [facility] CELL PHONE [phone] CALL FOR THERAPY APPT ALSO AT FIRST POST OP [phone] EXT [phone] KEEP HAND ELEVATED ABOVE HEART OK TO APPLY ICE TO OPERATIVE AREA CONTACT OFFICE IF ANY WORSENING PAIN OR CONCERNS. Post Anesthesia Home Care Instructions  Activity: Get plenty of rest for the remainder of the day. A responsible adult should stay with you for 24 hours following the procedure.  For the next 24 hours, DO NOT: -Drive a car -Advertising copywriter -Drink alcoholic beverages -Take any medication unless instructed by your physician -Make any legal decisions or sign important papers.  Meals: Start with liquid foods such as gelatin or soup. Progress to regular foods as tolerated. Avoid greasy, spicy, heavy foods. If nausea and/or vomiting occur, drink only clear liquids until the nausea and/or vomiting subsides. Call your physician if vomiting continues.  Special Instructions/Symptoms: Your throat may feel dry or sore from the anesthesia or the breathing tube placed in your throat during surgery. If this causes discomfort, gargle with warm salt water. The discomfort should disappear within 24 hours.       HAND SURGERY    HOME CARE INSTRUCTIONS    The following instructions have been prepared to help you care for yourself upon your return home today.  Wound Care:  Keep your hand elevated above the level of your heart. Do not allow it to dangle by your side. Keep the dressing dry and do not remove it unless your doctor advises you to do so. He will usually change it at the time of you post-op visit. Moving your fingers is advised to stimulate circulation but will depend on the site of your surgery. Of course, if you have a splint applied your doctor will advise you about movement.  Activity:  Do not drive or operate machinery today. Rest today and then you may return to your normal activity and work as indicated by your  physician.  Diet: Drink liquids today or eat a light diet. You may resume a regular diet tomorrow.  General expectations: Pain for two or three days. Fingers may become slightly swollen.   Unexpected Observations- Call your doctor if any of these occur: Severe pain not relieved by pain medication. Elevated temperature. Dressing soaked with blood. Inability to move fingers. White or bluish color to fingers.

## 2014-09-10 ENCOUNTER — Encounter (HOSPITAL_BASED_OUTPATIENT_CLINIC_OR_DEPARTMENT_OTHER): Payer: Self-pay | Admitting: Orthopedic Surgery

## 2014-09-10 NOTE — Op Note (Signed)
Joe Lucas, Joe Lucas                ACCOUNT NO.:  [id number]  MEDICAL RECORD NO.:  [id number]  LOCATION:  E48C                         FACILITY:  [facility]  PHYSICIAN:  Madelynn Done, MD  DATE OF BIRTH:  05/05/1961  DATE OF PROCEDURE:  09/09/2014 DATE OF DISCHARGE:  09/05/2014                              OPERATIVE REPORT   PREOPERATIVE DIAGNOSIS:  Right thumb open distal phalanx fracture with exposed bone.  POSTOPERATIVE DIAGNOSIS:  Right thumb open distal phalanx fracture with exposed bone.  ATTENDING PHYSICIAN:  Madelynn Done, MD, who scrubbed and present for the entire procedure.  ASSISTANT SURGEON:  None.  ANESTHESIA:  A 1% Xylocaine, 0.25% Marcaine, local block with IV sedation.  SURGICAL PROCEDURE: 1. Excisional debridement of skin, subcutaneous tissue, and bone     associated with open fracture, right thumb distal phalanx fracture. 2. Open treatment, right thumb distal phalanx fracture without     internal fixation. 3. Right thumb advancement flap closure, Moberg-type flap advancement     closure.  SURGICAL INDICATIONS:  Joe Lucas is a right-hand-dominant gentleman who sustained an open injury to the right thumb with the exposed bone.  He presented to the office with the open injury.  It was recommended that he undergo the above procedure.  Risks, benefits, and alternatives were discussed in detail with the patient and signed informed consent was obtained.  Risks include, but not limited to bleeding, infection, damage to nearby nerves, arteries, or tendons, loss of motion of wrist and digits, incomplete relief of symptoms, and need for further surgical intervention.  DESCRIPTION OF PROCEDURE:  The patient was properly identified in the preoperative holding area and marked with a permanent marker, made on the right thumb to indicate the correct operative site.  The patient was brought back to the operating room, placed supine on the anesthesia table where the  IV sedation was administered.  The patient tolerated this well.  A well-padded tourniquet was placed on the right forearm, sealed with 1000 drape.  Local anesthetic was administered.  Right upper extremity was then prepped and draped in normal sterile fashion.  Time- out was called, the correct site was identified, and procedure was then begun.  Attention was then turned to the right thumb where excisional debridement was then carried out to the skin, subcutaneous tissue, and bones.  Portion of the distal phalanx that was fractured, it was then removed.  Open treatment of distal phalanx fracture and debridement of the open fracture was then carried out.  Following this, in order to close the defect, the mid axial incision was then made and the skin was then brought up first radially, ulnarly, creating the Moberg-type advancement flap, closing the defect.  This was sewn in distally with 4- 0 and 5-0 chromic sutures along the mid axial incision line sewed in with Prolene.  The wound was irrigated at all levels.  After excisional debridement and closure, the tourniquet was deflated.  There was good perfusion of the fingertip.  Adaptic dressing, sterile compressive bandage was then applied.  The patient tolerated the procedure well and returned to recovery room in good condition.  POSTPROCEDURE PLAN:  The patient  has been discharged to home, seen back in the office in approximately 1 week for wound check, application of a small protective splint, gradual use and activity.  Radiographs at the first visit.     Madelynn Done, MD     FWO/MEDQ  D:  09/09/2014  T:  09/10/2014  Job:  [id number]

## 2015-09-11 IMAGING — CR DG FINGER THUMB 2+V*R*
3 series · 3 of 3 positions shown · non-contrast
Comparison: No priors.

CLINICAL DATA: Injury to the right thumb.

EXAM:
RIGHT THUMB 2+V

[x finger pa right *]
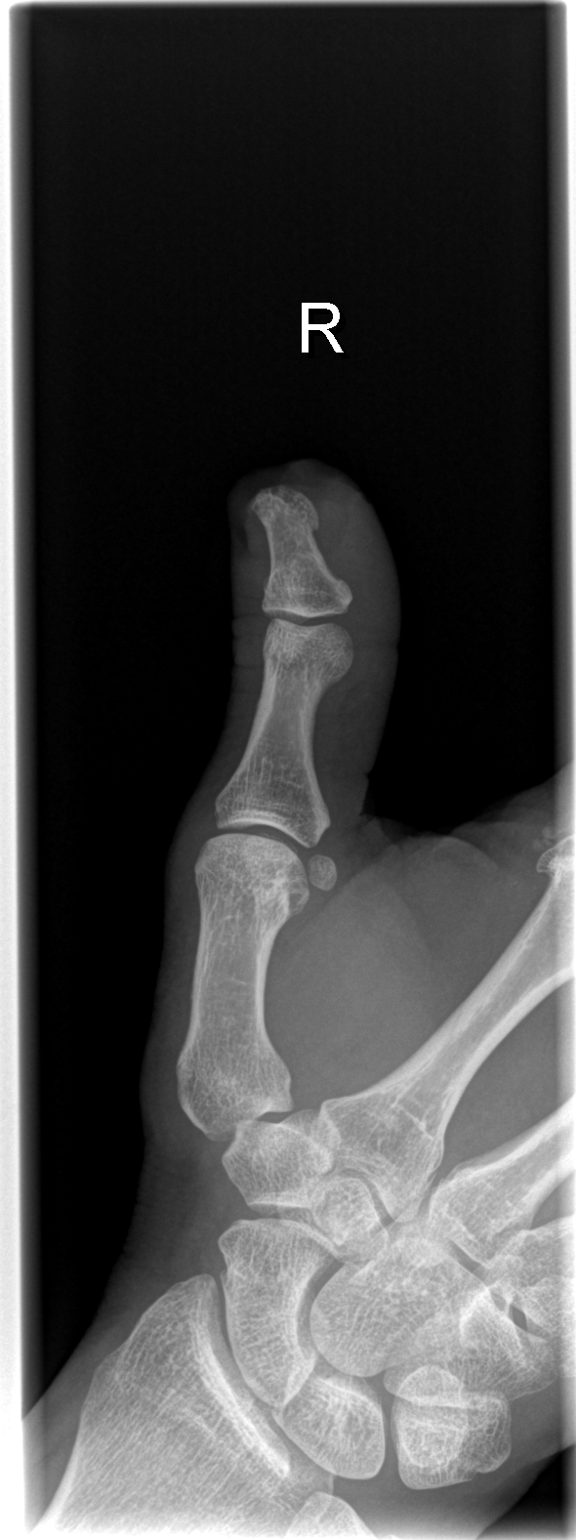

[x finger obl. right *]
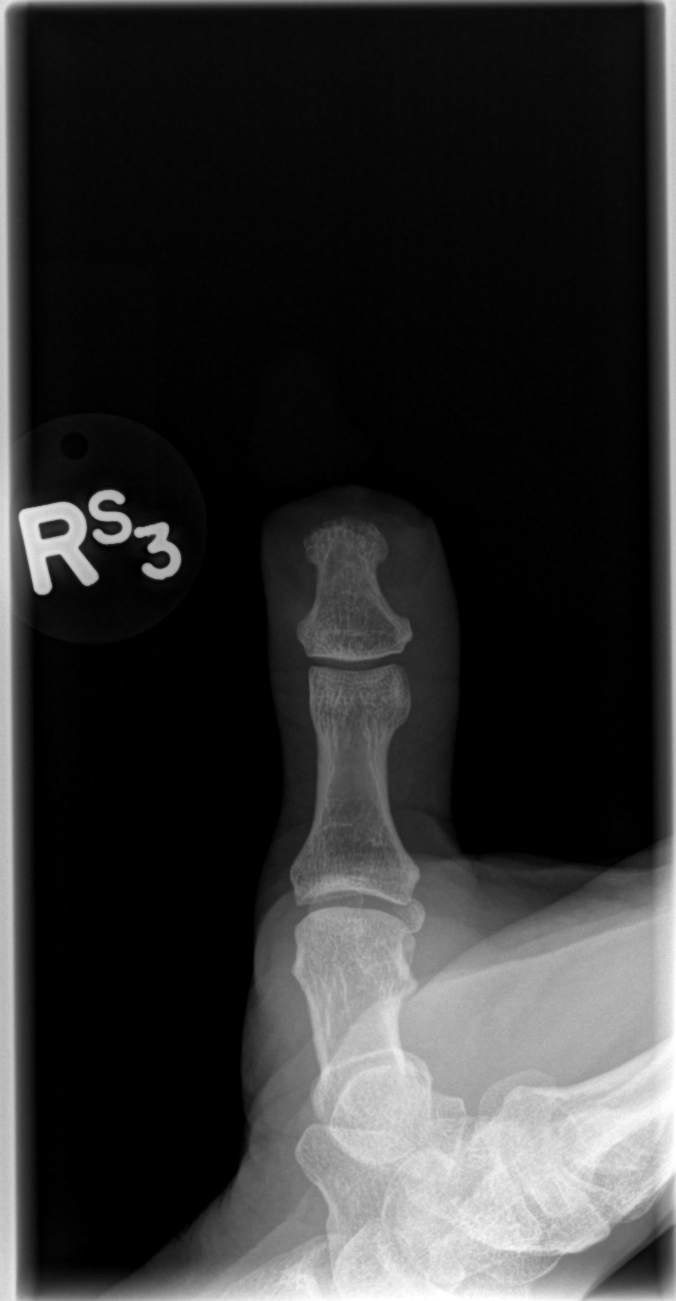

[x finger lateral right *]
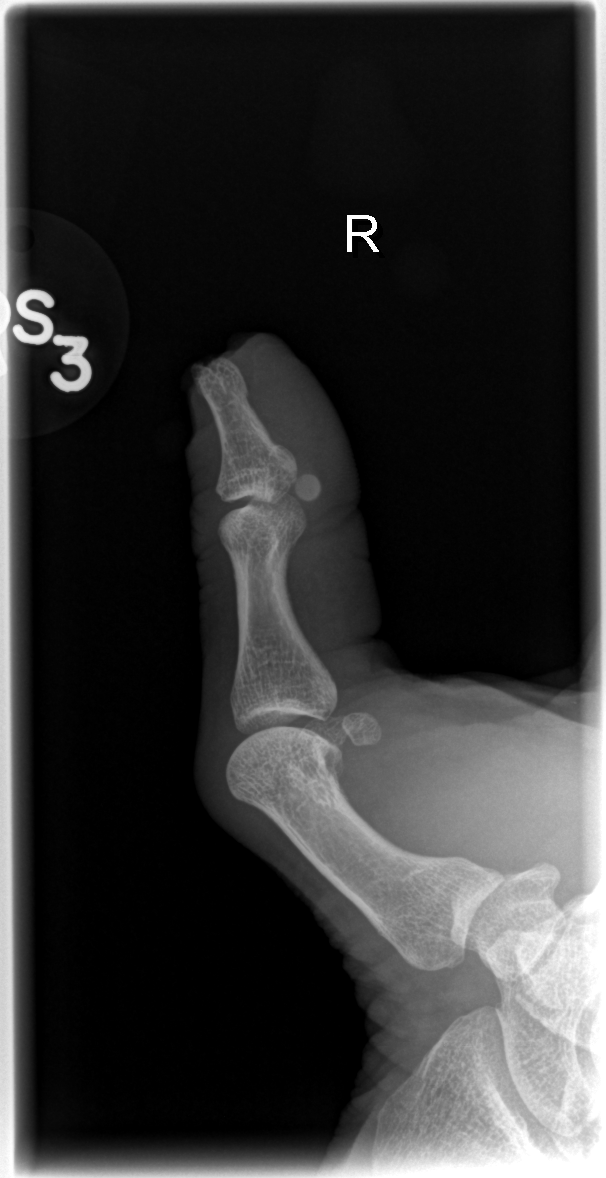

[3 of 3 positions shown; findings below may reference images not displayed]

FINDINGS: There is a large soft tissue defect on the distal aspect of the
right thumb. There appears to be a small osseous defect from the
tuft of the first distal phalanx. Visualized bones otherwise appear
intact.
IMPRESSION: 1. Small open fracture of the tuft of the distal phalanx of the
right first digit.

## 2018-12-15 ENCOUNTER — Other Ambulatory Visit: Payer: Self-pay | Admitting: Physical Medicine and Rehabilitation

## 2018-12-15 DIAGNOSIS — M5416 Radiculopathy, lumbar region: Secondary | ICD-10-CM

## 2018-12-31 ENCOUNTER — Other Ambulatory Visit: Payer: Self-pay | Admitting: Physical Medicine and Rehabilitation

## 2018-12-31 ENCOUNTER — Ambulatory Visit
Admission: RE | Admit: 2018-12-31 | Discharge: 2018-12-31 | Disposition: A | Discharge status: Home / Self Care | Payer: BLUE CROSS/BLUE SHIELD | Source: Ambulatory Visit | Attending: Physical Medicine and Rehabilitation | Admitting: Physical Medicine and Rehabilitation

## 2018-12-31 DIAGNOSIS — M5416 Radiculopathy, lumbar region: Secondary | ICD-10-CM

## 2018-12-31 MED ORDER — METHYLPREDNISOLONE ACETATE 40 MG/ML INJ SUSP (RADIOLOG
120.0000 mg | Freq: Once | INTRAMUSCULAR | Status: AC
  Administered 2018-12-31: 120 mg via EPIDURAL

## 2018-12-31 MED ORDER — IOPAMIDOL (ISOVUE-M 200) INJECTION 41%
1.0000 mL | Freq: Once | INTRAMUSCULAR | Status: AC
  Administered 2018-12-31: 1 mL via EPIDURAL

## 2018-12-31 NOTE — Discharge Instructions (Signed)

## 2021-08-10 ENCOUNTER — Other Ambulatory Visit: Payer: Self-pay | Admitting: Physician Assistant

## 2021-08-10 ENCOUNTER — Ambulatory Visit
Admission: RE | Admit: 2021-08-10 | Discharge: 2021-08-10 | Disposition: A | Discharge status: Home / Self Care | Payer: BLUE CROSS/BLUE SHIELD | Source: Ambulatory Visit | Attending: Physician Assistant | Admitting: Physician Assistant

## 2021-08-10 DIAGNOSIS — M542 Cervicalgia: Secondary | ICD-10-CM

## 2021-08-10 DIAGNOSIS — M5459 Other low back pain: Secondary | ICD-10-CM

## 2021-09-06 ENCOUNTER — Other Ambulatory Visit: Payer: Self-pay | Admitting: Physician Assistant

## 2021-09-06 DIAGNOSIS — T148XXA Other injury of unspecified body region, initial encounter: Secondary | ICD-10-CM

## 2021-09-22 ENCOUNTER — Other Ambulatory Visit: Payer: Self-pay

## 2021-09-22 ENCOUNTER — Ambulatory Visit
Admission: RE | Admit: 2021-09-22 | Discharge: 2021-09-22 | Disposition: A | Discharge status: Home / Self Care | Payer: 59 | Source: Ambulatory Visit | Attending: Physician Assistant | Admitting: Physician Assistant

## 2021-09-22 DIAGNOSIS — T148XXA Other injury of unspecified body region, initial encounter: Secondary | ICD-10-CM

## 2022-07-05 ENCOUNTER — Ambulatory Visit
Admission: EM | Admit: 2022-07-05 | Discharge: 2022-07-05 | Disposition: A | Discharge status: Home / Self Care | Payer: 59

## 2022-07-05 DIAGNOSIS — L089 Local infection of the skin and subcutaneous tissue, unspecified: Secondary | ICD-10-CM

## 2022-07-05 MED ORDER — SULFAMETHOXAZOLE-TRIMETHOPRIM 800-160 MG PO TABS: 1.0000 | ORAL_TABLET | Freq: Two times a day (BID) | ORAL | 0 refills | Status: AC

## 2022-07-05 NOTE — Discharge Instructions (Addendum)
Soak area 20 minutes 4 times a day °
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
## 2022-07-05 NOTE — ED Triage Notes (Signed)
Pt states was scratched by a dog to his bottom lip a week ago and now has swelling, a knot, and sore.
